# Patient Record
Sex: Female | Born: 1950 | Race: White | Hispanic: No | Marital: Married | Smoking: Never smoker
Health system: Southern US, Community
[De-identification: ages and names within clinical notes are randomized; demographics above are authoritative.]

---

## 1998-03-17 ENCOUNTER — Other Ambulatory Visit: Admission: RE | Admit: 1998-03-17 | Discharge: 1998-03-17 | Payer: Self-pay | Admitting: Obstetrics and Gynecology

## 1999-06-24 ENCOUNTER — Other Ambulatory Visit: Admission: RE | Admit: 1999-06-24 | Discharge: 1999-06-24 | Payer: Self-pay | Admitting: Obstetrics and Gynecology

## 2000-12-08 ENCOUNTER — Other Ambulatory Visit: Admission: RE | Admit: 2000-12-08 | Discharge: 2000-12-08 | Payer: Self-pay | Admitting: Obstetrics and Gynecology

## 2002-03-05 ENCOUNTER — Other Ambulatory Visit: Admission: RE | Admit: 2002-03-05 | Discharge: 2002-03-05 | Payer: Self-pay | Admitting: Obstetrics and Gynecology

## 2012-11-08 ENCOUNTER — Encounter (HOSPITAL_COMMUNITY): Payer: Self-pay | Admitting: Emergency Medicine

## 2012-11-08 ENCOUNTER — Emergency Department (HOSPITAL_COMMUNITY)
Admission: EM | Admit: 2012-11-08 | Discharge: 2012-11-08 | Disposition: A | Discharge status: Home / Self Care | Payer: Self-pay | Attending: Emergency Medicine | Admitting: Emergency Medicine

## 2012-11-08 DIAGNOSIS — Z23 Encounter for immunization: Secondary | ICD-10-CM | POA: Insufficient documentation

## 2012-11-08 DIAGNOSIS — Z203 Contact with and (suspected) exposure to rabies: Secondary | ICD-10-CM | POA: Insufficient documentation

## 2012-11-08 MED ORDER — RABIES VACCINE, PCEC IM SUSR
1.0000 mL | Freq: Once | INTRAMUSCULAR | Status: AC
  Administered 2012-11-08: 1 mL via INTRAMUSCULAR
  Filled 2012-11-08 (×2): qty 1

## 2012-11-08 MED ORDER — RABIES IMMUNE GLOBULIN 150 UNIT/ML IM INJ
20.0000 [IU]/kg | INJECTION | Freq: Once | INTRAMUSCULAR | Status: AC
  Administered 2012-11-08: 1575 [IU] via INTRAMUSCULAR
  Filled 2012-11-08 (×2): qty 10.5

## 2012-11-08 NOTE — ED Provider Notes (Signed)
   History    This chart was scribed for non-physician practitioner Arnoldo Hooker PA-C working with Flint Melter, MD by Donne Anon, ED Scribe. This patient was seen in room WTR8/WTR8 and the patient's care was started at 1519.  CSN: [id number] Arrival date & time 11/08/12  1444  First MD Initiated Contact with Patient 11/08/12 1519     Chief Complaint  Patient presents with  . Body Fluid Exposure    The history is provided by the patient. No language interpreter was used.   HPI Comments: Jane Webb is a 62 y.o. female who presents to the Emergency Department complaining of rabies exposure. She states that 4 days ago she was walking in a park and saw a racoon was wounded and near death. She carried the racoon wrapped up in her shirt and to get help. The racoon was subsequently tested and found positive for rabies. She denies any bites or scratches from the racoon. She denies any other symptoms. She is here for rabies exposure vaccine series.  History reviewed. No pertinent past medical history. History reviewed. No pertinent past surgical history. Family History  Problem Relation Age of Onset  . Cancer Mother   . Alzheimer's disease Father   . Cancer Other    History  Substance Use Topics  . Smoking status: Never Smoker   . Smokeless tobacco: Not on file  . Alcohol Use: Yes   OB History   Grav Para Term Preterm Abortions TAB SAB Ect Mult Living                 Review of Systems  Constitutional: Negative for fever.  Gastrointestinal: Negative for vomiting.  Skin: Negative for wound.  All other systems reviewed and are negative.    Allergies  Review of patient's allergies indicates no known allergies.  Home Medications  No current outpatient prescriptions on file.  BP 131/85  Pulse 76  Temp(Src) 98.7 F (37.1 C) (Oral)  Resp 20  SpO2 99%  Physical Exam  Nursing note and vitals reviewed. Constitutional: She appears well-developed and well-nourished. No  distress.  HENT:  Head: Normocephalic and atraumatic.  Eyes: Conjunctivae are normal.  Neck: Neck supple. No tracheal deviation present.  Cardiovascular: Normal rate.   Pulmonary/Chest: Effort normal. No respiratory distress.  Musculoskeletal: Normal range of motion.  Neurological: She is alert.  Skin: Skin is warm, dry and intact. No abrasion and no laceration noted. No erythema.  Psychiatric: She has a normal mood and affect. Her behavior is normal.    ED Course  Procedures (including critical care time) DIAGNOSTIC STUDIES: Oxygen Saturation is 99% on RA, normal by my interpretation.    COORDINATION OF CARE: 3:19 PM Discussed treatment plan which includes rabies vaccine series with pt at bedside and pt agreed to plan.    Labs Reviewed - No data to display No results found. No diagnosis found. 1. Rabies exposure MDM  Patient had direct contact with rabid animal. Rabies vaccination series started.   I personally performed the services described in this documentation, which was scribed in my presence. The recorded information has been reviewed and is accurate.     Arnoldo Hooker, PA-C 11/15/12 1418

## 2012-11-08 NOTE — ED Notes (Signed)
Pt states that on Monday she was walking thorugh the park and seen a dead animal rac on the side of the walk way and it was partially dead with flies around it, she told park ranger to try to get it up and they did not so she picked it up with her shirt and carried it to 2 trash men to throw it away. She said that they had it tested and it was positive for rabies. Pt was told by the health dept to come to er to get shots. Pt denies any c/o states she did not get any blood on her that she knows of.

## 2012-11-11 ENCOUNTER — Emergency Department (HOSPITAL_COMMUNITY)
Admission: EM | Admit: 2012-11-11 | Discharge: 2012-11-11 | Disposition: A | Discharge status: Home / Self Care | Payer: Self-pay | Attending: Emergency Medicine | Admitting: Emergency Medicine

## 2012-11-11 ENCOUNTER — Encounter (HOSPITAL_COMMUNITY): Payer: Self-pay | Admitting: *Deleted

## 2012-11-11 DIAGNOSIS — Z79899 Other long term (current) drug therapy: Secondary | ICD-10-CM | POA: Insufficient documentation

## 2012-11-11 DIAGNOSIS — Z203 Contact with and (suspected) exposure to rabies: Secondary | ICD-10-CM | POA: Insufficient documentation

## 2012-11-11 DIAGNOSIS — Z23 Encounter for immunization: Secondary | ICD-10-CM | POA: Insufficient documentation

## 2012-11-11 MED ORDER — RABIES VACCINE, PCEC IM SUSR
1.0000 mL | Freq: Once | INTRAMUSCULAR | Status: AC
  Administered 2012-11-11: 1 mL via INTRAMUSCULAR
  Filled 2012-11-11: qty 1

## 2012-11-11 NOTE — ED Provider Notes (Signed)
CSN: [id number]     Arrival date & time 11/11/12  1546 History  This chart was scribed for Junious Silk, PA-C working with Hurman Horn, MD by Greggory Stallion, ED scribe. This patient was seen in room WTR5/WTR5 and the patient's care was started at 5:39 PM.   Chief Complaint  Patient presents with  . Rabies Injection   The history is provided by the patient. No language interpreter was used.    HPI Comments: Manasi Dishon is a 62 y.o. female who presents to the Emergency Department for her rabies injections. She states she initially picked up a raccoon. Pt states she wrapped a bandana around the raccoon to pick it up and put it on a truck. She states she was never bit by the raccoon and was later told it had rabies so she wanted to be safe and get the rabies shots. She states this will be her second round of rabies shots. She is compliant with her rabies follow up. Pt denies any symptoms currently.   History reviewed. No pertinent past medical history. History reviewed. No pertinent past surgical history. Family History  Problem Relation Age of Onset  . Cancer Mother   . Alzheimer's disease Father   . Cancer Other    History  Substance Use Topics  . Smoking status: Never Smoker   . Smokeless tobacco: Not on file  . Alcohol Use: Yes   OB History   Grav Para Term Preterm Abortions TAB SAB Ect Mult Living                 Review of Systems  Constitutional: Negative for fever and chills.  Respiratory: Negative for shortness of breath.   Gastrointestinal: Negative for nausea, vomiting and abdominal pain.  Skin: Negative for wound.  Neurological: Negative for headaches.  All other systems reviewed and are negative.    Allergies  Review of patient's allergies indicates no known allergies.  Home Medications   Current Outpatient Rx  Name  Route  Sig  Dispense  Refill  . cholecalciferol (VITAMIN D) 1000 UNITS tablet   Oral   Take 1,000 Units by mouth daily.         .  cyanocobalamin 500 MCG tablet   Oral   Take 500 mcg by mouth daily.         Marland Kitchen FLUoxetine (PROZAC) 20 MG capsule   Oral   Take 20 mg by mouth. Every other day          BP 150/96  Pulse 65  Temp(Src) 98.6 F (37 C) (Oral)  Resp 18  SpO2 99%  Physical Exam  Nursing note and vitals reviewed. Constitutional: She is oriented to person, place, and time. She appears well-developed and well-nourished. No distress.  HENT:  Head: Normocephalic and atraumatic.  Right Ear: External ear normal.  Left Ear: External ear normal.  Nose: Nose normal.  Mouth/Throat: Oropharynx is clear and moist.  Eyes: Conjunctivae are normal.  Neck: Normal range of motion.  Cardiovascular: Normal rate, regular rhythm and normal heart sounds.  Exam reveals no gallop and no friction rub.   No murmur heard. Pulmonary/Chest: Effort normal and breath sounds normal. No stridor. No respiratory distress. She has no wheezes. She has no rales.  Abdominal: Soft. She exhibits no distension.  Musculoskeletal: Normal range of motion.  Neurological: She is alert and oriented to person, place, and time. She has normal strength.  Skin: Skin is warm and dry. She is not diaphoretic. No erythema.  Psychiatric: She has a normal mood and affect. Her behavior is normal.    ED Course   Procedures (including critical care time)  DIAGNOSTIC STUDIES: Oxygen Saturation is 99% on RA, normal by my interpretation.    COORDINATION OF CARE: 5:44 PM-Discussed treatment plan which includes rabies shots with pt at bedside and pt agreed to plan.   Labs Reviewed - No data to display No results found. 1. Rabies exposure     MDM  Patient with rabies exposure. Asymptomatic. Here for 2nd vaccine. Continue your vaccination schedule. Return instructions given. Vital signs stable for discharge. Patient / Family / Caregiver informed of clinical course, understand medical decision-making process, and agree with plan.      I personally  performed the services described in this documentation, which was scribed in my presence. The recorded information has been reviewed and is accurate.    Mora Bellman, PA-C 11/11/12 1914

## 2012-11-11 NOTE — ED Notes (Signed)
Pt here for her second round of rabies vaccination

## 2012-11-13 NOTE — ED Provider Notes (Signed)
Medical screening examination/treatment/procedure(s) were performed by non-physician practitioner and as supervising physician I was immediately available for consultation/collaboration.  Gudelia Eugene M Sheletha Bow, MD 11/13/12 2029 

## 2012-11-15 ENCOUNTER — Encounter (HOSPITAL_COMMUNITY): Payer: Self-pay

## 2012-11-15 ENCOUNTER — Emergency Department (INDEPENDENT_AMBULATORY_CARE_PROVIDER_SITE_OTHER)
Admission: EM | Admit: 2012-11-15 | Discharge: 2012-11-15 | Disposition: A | Discharge status: Home / Self Care | Payer: Self-pay | Source: Home / Self Care

## 2012-11-15 DIAGNOSIS — Z203 Contact with and (suspected) exposure to rabies: Secondary | ICD-10-CM

## 2012-11-15 MED ORDER — RABIES VACCINE, PCEC IM SUSR
1.0000 mL | Freq: Once | INTRAMUSCULAR | Status: AC
  Administered 2012-11-15: 1 mL via INTRAMUSCULAR

## 2012-11-15 MED ORDER — RABIES VACCINE, PCEC IM SUSR
INTRAMUSCULAR | Status: AC
  Filled 2012-11-15: qty 1

## 2012-11-15 NOTE — ED Provider Notes (Signed)
Medical screening examination/treatment/procedure(s) were performed by non-physician practitioner and as supervising physician I was immediately available for consultation/collaboration.  Flint Melter, MD 11/15/12 2121

## 2012-11-15 NOTE — ED Notes (Signed)
Here for day #7 in series ; denies pain or post immunization issues from last visit

## 2012-11-22 ENCOUNTER — Emergency Department (INDEPENDENT_AMBULATORY_CARE_PROVIDER_SITE_OTHER)
Admission: EM | Admit: 2012-11-22 | Discharge: 2012-11-22 | Disposition: A | Discharge status: Home / Self Care | Payer: Self-pay | Source: Home / Self Care

## 2012-11-22 ENCOUNTER — Encounter (HOSPITAL_COMMUNITY): Payer: Self-pay | Admitting: Emergency Medicine

## 2012-11-22 DIAGNOSIS — Z23 Encounter for immunization: Secondary | ICD-10-CM

## 2012-11-22 MED ORDER — RABIES VACCINE, PCEC IM SUSR
INTRAMUSCULAR | Status: AC
  Filled 2012-11-22: qty 1

## 2012-11-22 MED ORDER — RABIES VACCINE, PCEC IM SUSR
1.0000 mL | Freq: Once | INTRAMUSCULAR | Status: AC
  Administered 2012-11-22: 1 mL via INTRAMUSCULAR

## 2012-11-22 NOTE — ED Notes (Signed)
Here today for final rabies injection in series-today is day 14

## 2017-01-10 ENCOUNTER — Other Ambulatory Visit: Payer: Self-pay | Admitting: Physician Assistant

## 2017-01-10 DIAGNOSIS — Z1231 Encounter for screening mammogram for malignant neoplasm of breast: Secondary | ICD-10-CM

## 2017-01-23 ENCOUNTER — Ambulatory Visit
Admission: RE | Admit: 2017-01-23 | Discharge: 2017-01-23 | Disposition: A | Discharge status: Home / Self Care | Payer: Medicare Other | Source: Ambulatory Visit | Attending: Physician Assistant | Admitting: Physician Assistant

## 2017-01-23 DIAGNOSIS — Z1231 Encounter for screening mammogram for malignant neoplasm of breast: Secondary | ICD-10-CM

## 2017-01-26 ENCOUNTER — Other Ambulatory Visit: Payer: Self-pay | Admitting: Physician Assistant

## 2017-01-26 DIAGNOSIS — R928 Other abnormal and inconclusive findings on diagnostic imaging of breast: Secondary | ICD-10-CM

## 2017-02-02 ENCOUNTER — Other Ambulatory Visit: Payer: Medicare Other

## 2017-02-09 ENCOUNTER — Ambulatory Visit
Admission: RE | Admit: 2017-02-09 | Discharge: 2017-02-09 | Disposition: A | Discharge status: Home / Self Care | Payer: Medicare Other | Source: Ambulatory Visit | Attending: Physician Assistant | Admitting: Physician Assistant

## 2017-02-09 DIAGNOSIS — R928 Other abnormal and inconclusive findings on diagnostic imaging of breast: Secondary | ICD-10-CM

## 2019-05-07 ENCOUNTER — Ambulatory Visit: Payer: Medicare HMO | Attending: Internal Medicine

## 2019-05-07 DIAGNOSIS — Z20822 Contact with and (suspected) exposure to covid-19: Secondary | ICD-10-CM

## 2019-05-08 LAB — NOVEL CORONAVIRUS, NAA: SARS-CoV-2, NAA: NOT DETECTED

## 2019-05-17 ENCOUNTER — Ambulatory Visit: Payer: Medicare HMO

## 2019-05-25 ENCOUNTER — Ambulatory Visit: Payer: Medicare HMO | Attending: Internal Medicine

## 2019-05-25 DIAGNOSIS — Z23 Encounter for immunization: Secondary | ICD-10-CM

## 2019-05-25 NOTE — Progress Notes (Signed)
   Covid-19 Vaccination Clinic  Name:  Royalti Schauf    MRN: [id number] DOB: August 14, 1950  05/25/2019  Ms. Kastelic was observed post Covid-19 immunization for 15 minutes without incidence. She was provided with Vaccine Information Sheet and instruction to access the V-Safe system.   Ms. Montalban was instructed to call 911 with any severe reactions post vaccine: Marland Kitchen Difficulty breathing  . Swelling of your face and throat  . A fast heartbeat  . A bad rash all over your body  . Dizziness and weakness    Immunizations Administered    Name Date Dose VIS Date Route   Pfizer COVID-19 Vaccine 05/25/2019  3:47 PM 0.3 mL 03/29/2019 Intramuscular   Manufacturer: [employer], [employer]   Lot: [id number]   NDC: [address]-6

## 2019-06-03 ENCOUNTER — Ambulatory Visit: Payer: Medicare HMO

## 2019-06-19 ENCOUNTER — Ambulatory Visit: Payer: Medicare HMO

## 2019-06-19 ENCOUNTER — Ambulatory Visit: Payer: Medicare HMO | Attending: Internal Medicine

## 2019-06-19 DIAGNOSIS — Z23 Encounter for immunization: Secondary | ICD-10-CM | POA: Insufficient documentation

## 2019-06-19 NOTE — Progress Notes (Signed)
   Covid-19 Vaccination Clinic  Name:  Jane Webb    MRN: [id number] DOB: 1950-12-19  06/19/2019  [name] was observed post Covid-19 immunization for 15 minutes without incident. She was provided with Vaccine Information Sheet and instruction to access the V-Safe system.   [name] was instructed to call 911 with any severe reactions post vaccine: Marland Kitchen Difficulty breathing  . Swelling of face and throat  . A fast heartbeat  . A bad rash all over body  . Dizziness and weakness   Immunizations Administered    Name Date Dose VIS Date Route   Pfizer COVID-19 Vaccine 06/19/2019  9:52 AM 0.3 mL 03/29/2019 Intramuscular   Manufacturer: [employer], [employer]   Lot: [id number]   NDC: [address]-4

## 2020-01-27 ENCOUNTER — Other Ambulatory Visit: Payer: Self-pay | Admitting: Physician Assistant

## 2020-01-27 DIAGNOSIS — E2839 Other primary ovarian failure: Secondary | ICD-10-CM

## 2020-01-27 DIAGNOSIS — Z1231 Encounter for screening mammogram for malignant neoplasm of breast: Secondary | ICD-10-CM

## 2020-05-11 ENCOUNTER — Ambulatory Visit
Admission: RE | Admit: 2020-05-11 | Discharge: 2020-05-11 | Disposition: A | Discharge status: Home / Self Care | Payer: Medicare HMO | Source: Ambulatory Visit | Attending: Physician Assistant | Admitting: Physician Assistant

## 2020-05-11 ENCOUNTER — Other Ambulatory Visit: Payer: Self-pay

## 2020-05-11 DIAGNOSIS — Z1231 Encounter for screening mammogram for malignant neoplasm of breast: Secondary | ICD-10-CM

## 2020-05-11 DIAGNOSIS — E2839 Other primary ovarian failure: Secondary | ICD-10-CM

## 2021-08-18 ENCOUNTER — Other Ambulatory Visit: Payer: Self-pay | Admitting: Physician Assistant

## 2021-08-18 DIAGNOSIS — Z1231 Encounter for screening mammogram for malignant neoplasm of breast: Secondary | ICD-10-CM

## 2021-08-24 ENCOUNTER — Ambulatory Visit
Admission: RE | Admit: 2021-08-24 | Discharge: 2021-08-24 | Disposition: A | Discharge status: Home / Self Care | Payer: Medicare HMO | Source: Ambulatory Visit | Attending: Physician Assistant | Admitting: Physician Assistant

## 2021-08-24 DIAGNOSIS — Z1231 Encounter for screening mammogram for malignant neoplasm of breast: Secondary | ICD-10-CM

## 2022-09-19 ENCOUNTER — Other Ambulatory Visit: Payer: Self-pay | Admitting: Family Medicine

## 2022-09-19 DIAGNOSIS — Z1231 Encounter for screening mammogram for malignant neoplasm of breast: Secondary | ICD-10-CM

## 2022-10-06 ENCOUNTER — Ambulatory Visit
Admission: RE | Admit: 2022-10-06 | Discharge: 2022-10-06 | Disposition: A | Discharge status: Home / Self Care | Payer: Medicare HMO | Source: Ambulatory Visit | Attending: Family Medicine | Admitting: Family Medicine

## 2022-10-06 DIAGNOSIS — Z1231 Encounter for screening mammogram for malignant neoplasm of breast: Secondary | ICD-10-CM

## 2024-02-18 ENCOUNTER — Other Ambulatory Visit (HOSPITAL_BASED_OUTPATIENT_CLINIC_OR_DEPARTMENT_OTHER): Payer: Self-pay | Admitting: Family Medicine

## 2024-02-18 DIAGNOSIS — Z1231 Encounter for screening mammogram for malignant neoplasm of breast: Secondary | ICD-10-CM

## 2024-02-18 DIAGNOSIS — Z780 Asymptomatic menopausal state: Secondary | ICD-10-CM

## 2024-04-19 IMAGING — MG MM DIGITAL SCREENING BILAT W/ TOMO AND CAD
8 series · 9 of 24 positions shown · non-contrast
Comparison: Previous exam(s).

CLINICAL DATA: Screening.

EXAM:
DIGITAL SCREENING BILATERAL MAMMOGRAM WITH TOMOSYNTHESIS AND CAD
TECHNIQUE: Bilateral screening digital craniocaudal and mediolateral oblique
mammograms were obtained. Bilateral screening digital breast
tomosynthesis was performed. The images were evaluated with
computer-aided detection.

[L MLO synth-2D]
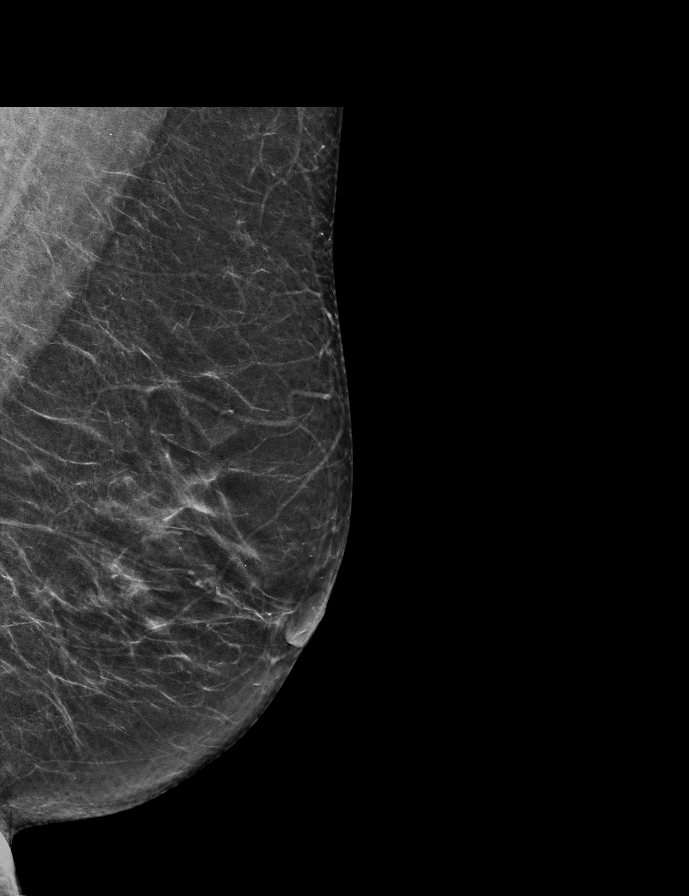

[R MLO synth-2D]
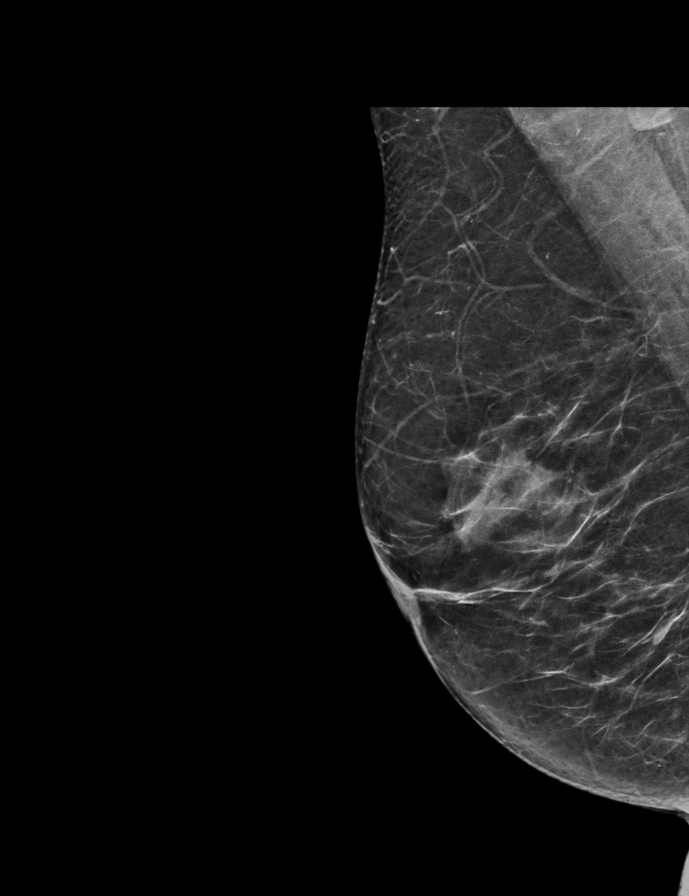

[R CC synth-2D]
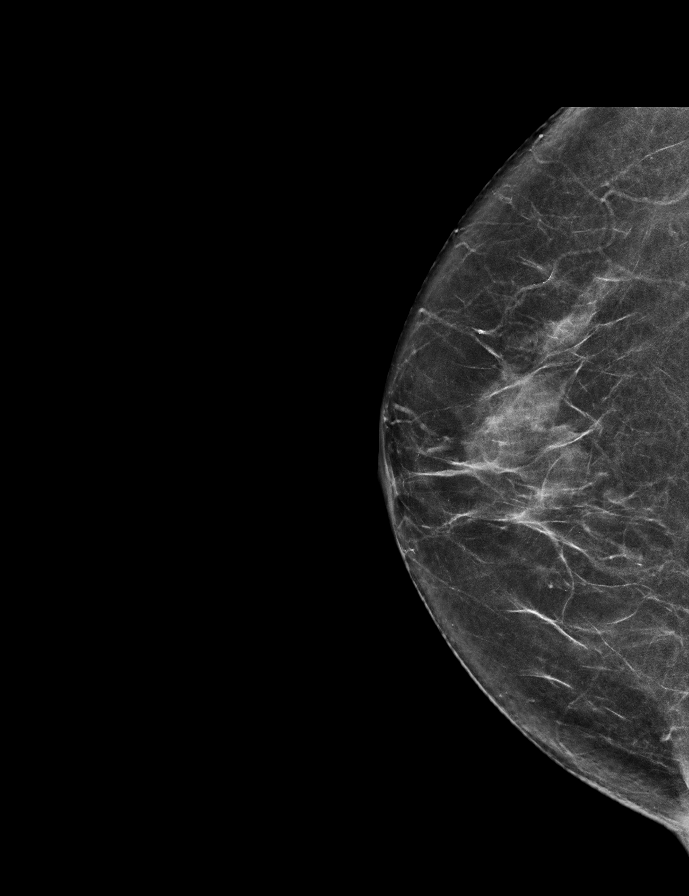

[L CC synth-2D]
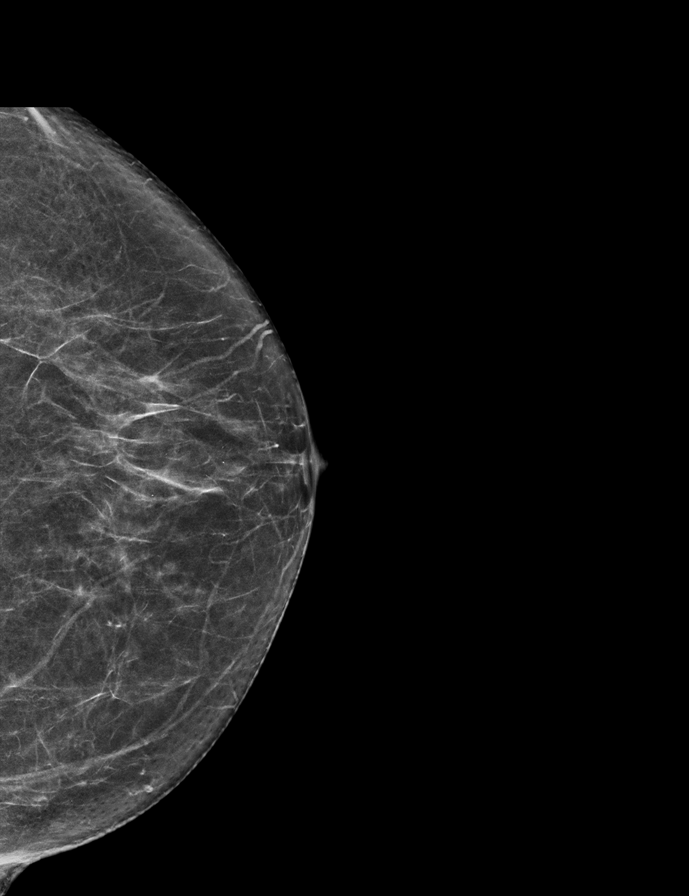

[R CC tomo · 2 of 66 frames shown]
[frame 22/66]
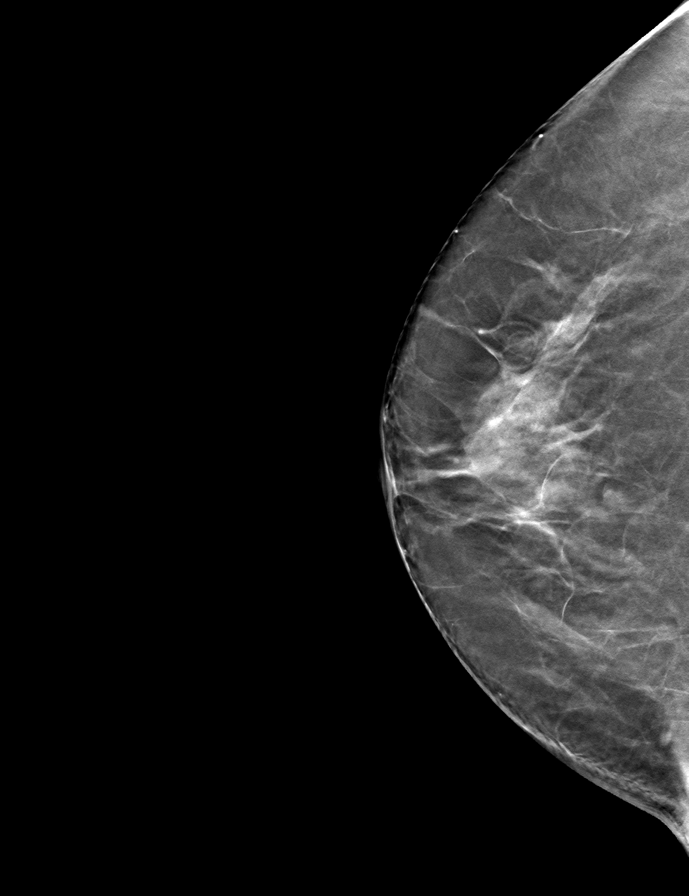
[frame 33/66]
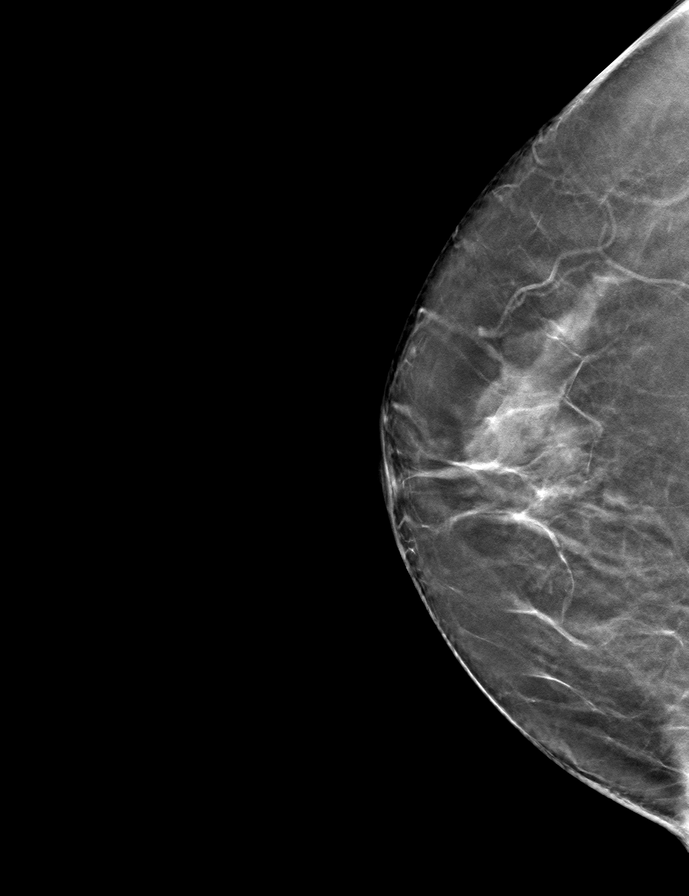

[L CC tomo · tomo slice 31/61.0]
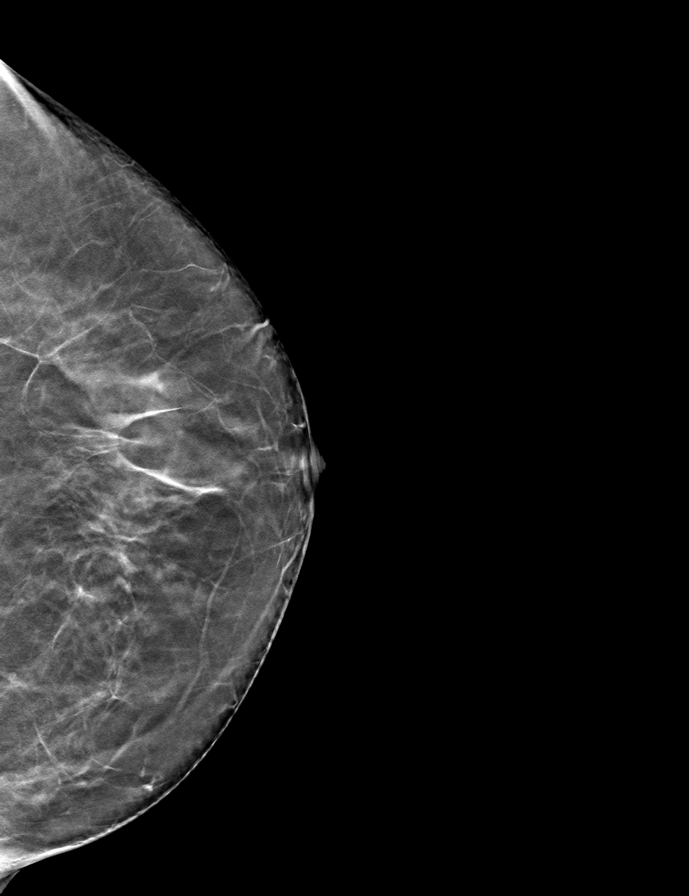

[R MLO tomo · tomo slice 31/60.0]
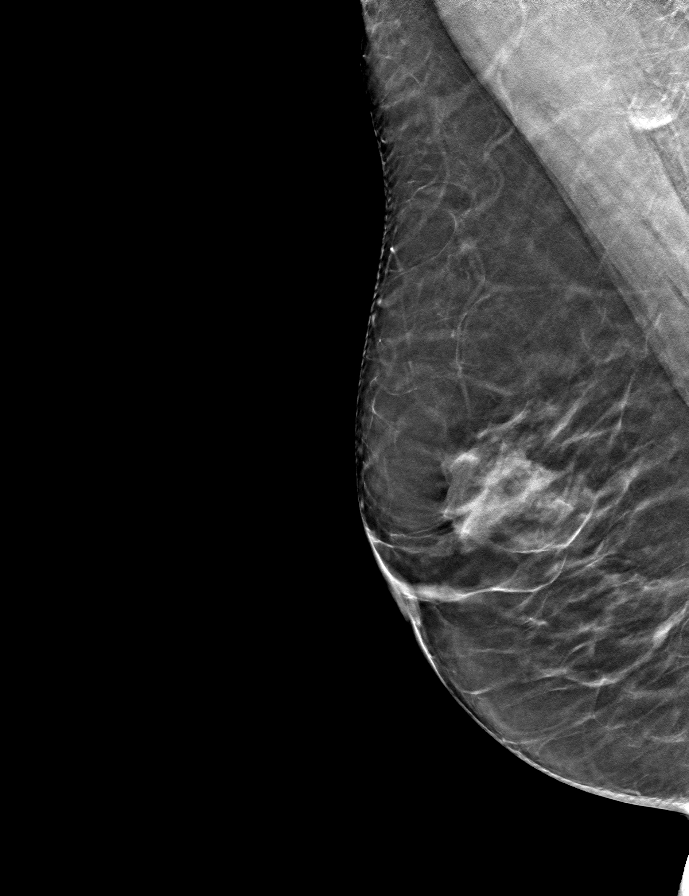

[L MLO tomo · tomo slice 32/63.0]
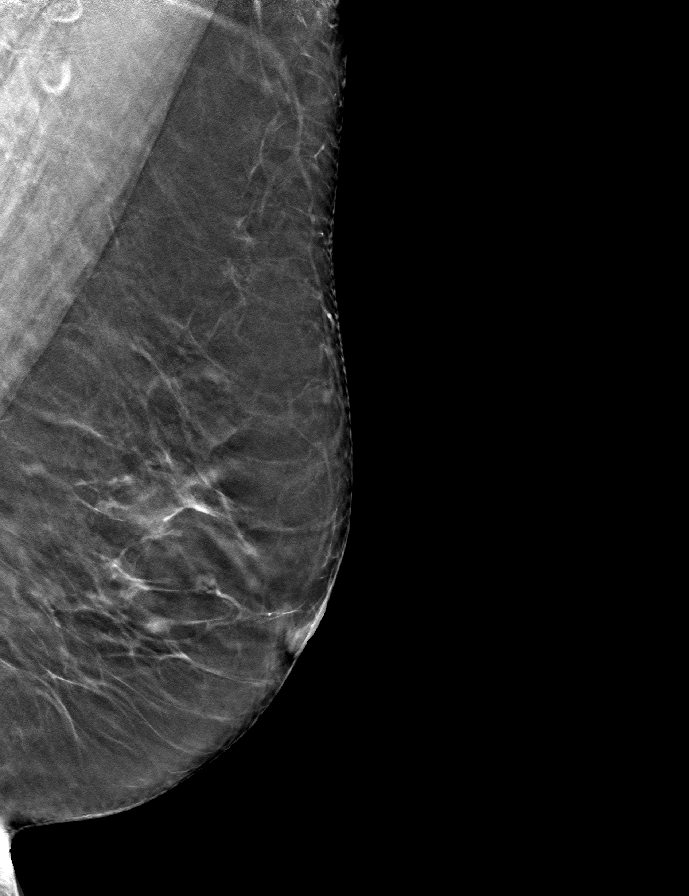

[9 of 24 positions shown; findings below may reference images not displayed]

ACR Breast Density Category b: There are scattered areas of
fibroglandular density.
FINDINGS: There are no findings suspicious for malignancy.
IMPRESSION: No mammographic evidence of malignancy. A result letter of this
screening mammogram will be mailed directly to the patient.

RECOMMENDATION:
Screening mammogram in one year. (Code:51-O-LD2)

BI-RADS CATEGORY  1: Negative.

## 2024-04-22 ENCOUNTER — Ambulatory Visit (HOSPITAL_BASED_OUTPATIENT_CLINIC_OR_DEPARTMENT_OTHER)
Admission: RE | Admit: 2024-04-22 | Discharge: 2024-04-22 | Disposition: A | Discharge status: Home / Self Care | Source: Ambulatory Visit | Attending: Family Medicine | Admitting: Family Medicine

## 2024-04-22 ENCOUNTER — Encounter (HOSPITAL_BASED_OUTPATIENT_CLINIC_OR_DEPARTMENT_OTHER): Payer: Self-pay

## 2024-04-22 DIAGNOSIS — Z780 Asymptomatic menopausal state: Secondary | ICD-10-CM | POA: Insufficient documentation

## 2024-04-22 DIAGNOSIS — Z1231 Encounter for screening mammogram for malignant neoplasm of breast: Secondary | ICD-10-CM | POA: Diagnosis present
